# Patient Record
Sex: Male | Born: 1990 | Race: Black or African American | Hispanic: No | Marital: Single | Smoking: Never smoker
Health system: Southern US, Community
[De-identification: ages and names within clinical notes are randomized; demographics above are authoritative.]

## PROBLEM LIST (undated history)

## (undated) HISTORY — PX: KNEE SURGERY: SHX244

---
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
## 2011-07-24 ENCOUNTER — Emergency Department (HOSPITAL_COMMUNITY)
Admission: EM | Admit: 2011-07-24 | Discharge: 2011-07-24 | Disposition: A | Discharge status: Home / Self Care | Payer: BC Managed Care – PPO | Attending: Emergency Medicine | Admitting: Emergency Medicine

## 2011-07-24 ENCOUNTER — Encounter (HOSPITAL_COMMUNITY): Payer: Self-pay

## 2011-07-24 DIAGNOSIS — K029 Dental caries, unspecified: Secondary | ICD-10-CM | POA: Insufficient documentation

## 2011-07-24 DIAGNOSIS — K047 Periapical abscess without sinus: Secondary | ICD-10-CM

## 2011-07-24 MED ORDER — HYDROCODONE-ACETAMINOPHEN 5-325 MG PO TABS: 1.0000 | ORAL_TABLET | Freq: Four times a day (QID) | ORAL | Status: DC | PRN

## 2011-07-24 MED ORDER — PENICILLIN V POTASSIUM 500 MG PO TABS: 500.0000 mg | ORAL_TABLET | Freq: Four times a day (QID) | ORAL | Status: AC

## 2011-07-24 MED ORDER — IBUPROFEN 800 MG PO TABS: 800.0000 mg | ORAL_TABLET | Freq: Three times a day (TID) | ORAL | Status: DC | PRN

## 2011-07-24 MED ORDER — PENICILLIN V POTASSIUM 500 MG PO TABS: 500.0000 mg | ORAL_TABLET | Freq: Four times a day (QID) | ORAL | Status: DC

## 2011-07-24 MED ORDER — IBUPROFEN 800 MG PO TABS: 800.0000 mg | ORAL_TABLET | Freq: Three times a day (TID) | ORAL | Status: AC | PRN

## 2011-07-24 MED ORDER — HYDROCODONE-ACETAMINOPHEN 5-325 MG PO TABS: 1.0000 | ORAL_TABLET | Freq: Four times a day (QID) | ORAL | Status: AC | PRN

## 2011-07-24 NOTE — ED Provider Notes (Signed)
History     CSN: [id number]  Arrival date & time 07/24/11  1505   First MD Initiated Contact with Patient 07/24/11 1507      No chief complaint on file.   (Consider location/radiation/quality/duration/timing/severity/associated sxs/prior treatment) HPI The patient presents to the ER with a 4 day history of dental pain and possible abscess. The patient states that he has no diff breathing, tongue swelling, swelling under the tongue, fever, or N/V. Patient has had issues with this tooth in the past.  No past medical history on file.  No past surgical history on file.  No family history on file.  History  Substance Use Topics  . Smoking status: Not on file  . Smokeless tobacco: Not on file  . Alcohol Use: Not on file      Review of Systems All other systems negative except as documented in the HPI. All pertinent positives and negatives as reviewed in the HPI.  Allergies  Review of patient's allergies indicates not on file.  Home Medications  No current outpatient prescriptions on file.  There were no vitals taken for this visit.  Physical Exam Physical Examination: General appearance - alert, well appearing, and in no distress, oriented to person, place, and time and normal appearing weight Mental status - alert, oriented to person, place, and time, normal mood, behavior, speech, dress, motor activity, and thought processes Mouth - mucous membranes moist, pharynx normal without lesions and the patient has decay and damage to the R lower 2nd molar with swelling along the gum line. The airway is patent and there is no swelling under the tongue or in the neck. Neck - supple, no significant adenopathy Chest - clear to auscultation, no wheezes, rales or rhonchi, symmetric air entry Heart - normal rate, regular rhythm, normal S1, S2, no murmurs, rubs, clicks or gallops  ED Course  Procedures (including critical care time)  Patient will be referred to dentistry for  evaluation. The patient is advised to return here for any worsening in his condition.    MDM          Carlyle Dolly, PA-C 07/24/11 1513

## 2011-07-24 NOTE — ED Notes (Signed)
Pt in from home with dental pain states pain to the right lower jaw states broken tooth pt c/o difficulty when chewing some swelling noted to the jaw

## 2011-07-26 NOTE — ED Provider Notes (Signed)
Medical screening examination/treatment/procedure(s) were performed by non-physician practitioner and as supervising physician I was immediately available for consultation/collaboration.  Tynika Luddy T Jamerson Vonbargen, MD 07/26/11 0605 

## 2012-12-17 ENCOUNTER — Encounter (HOSPITAL_BASED_OUTPATIENT_CLINIC_OR_DEPARTMENT_OTHER): Payer: Self-pay

## 2012-12-17 ENCOUNTER — Emergency Department (HOSPITAL_BASED_OUTPATIENT_CLINIC_OR_DEPARTMENT_OTHER)
Admission: EM | Admit: 2012-12-17 | Discharge: 2012-12-17 | Disposition: A | Discharge status: Home / Self Care | Payer: BC Managed Care – PPO | Attending: Emergency Medicine | Admitting: Emergency Medicine

## 2012-12-17 DIAGNOSIS — S0990XA Unspecified injury of head, initial encounter: Secondary | ICD-10-CM | POA: Insufficient documentation

## 2012-12-17 DIAGNOSIS — Y9389 Activity, other specified: Secondary | ICD-10-CM | POA: Insufficient documentation

## 2012-12-17 DIAGNOSIS — IMO0002 Reserved for concepts with insufficient information to code with codable children: Secondary | ICD-10-CM | POA: Insufficient documentation

## 2012-12-17 DIAGNOSIS — Y92410 Unspecified street and highway as the place of occurrence of the external cause: Secondary | ICD-10-CM | POA: Insufficient documentation

## 2012-12-17 MED ORDER — IBUPROFEN 800 MG PO TABS
800.0000 mg | ORAL_TABLET | Freq: Once | ORAL | Status: AC
  Administered 2012-12-17: 800 mg via ORAL
  Filled 2012-12-17: qty 1
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
## 2012-12-18 NOTE — ED Provider Notes (Signed)
Medical screening examination/treatment/procedure(s) were performed by non-physician practitioner and as supervising physician I was immediately available for consultation/collaboration.  Toy Baker, MD 12/18/12 [phone]

## 2019-05-21 ENCOUNTER — Other Ambulatory Visit: Payer: Self-pay

## 2019-05-21 ENCOUNTER — Encounter (HOSPITAL_COMMUNITY): Payer: Self-pay | Admitting: Emergency Medicine

## 2019-05-21 ENCOUNTER — Ambulatory Visit (HOSPITAL_COMMUNITY)
Admission: EM | Admit: 2019-05-21 | Discharge: 2019-05-21 | Disposition: A | Discharge status: Home / Self Care | Payer: Self-pay | Attending: Physician Assistant | Admitting: Physician Assistant

## 2019-05-21 DIAGNOSIS — R1011 Right upper quadrant pain: Secondary | ICD-10-CM | POA: Insufficient documentation

## 2019-05-21 DIAGNOSIS — Z7689 Persons encountering health services in other specified circumstances: Secondary | ICD-10-CM | POA: Insufficient documentation

## 2019-05-21 DIAGNOSIS — R0789 Other chest pain: Secondary | ICD-10-CM | POA: Insufficient documentation

## 2019-05-21 LAB — COMPREHENSIVE METABOLIC PANEL
ALT: 19 U/L (ref 0–44)
AST: 23 U/L (ref 15–41)
Albumin: 3.6 g/dL (ref 3.5–5.0)
Alkaline Phosphatase: 63 U/L (ref 38–126)
Anion gap: 9 (ref 5–15)
BUN: 11 mg/dL (ref 6–20)
CO2: 27 mmol/L (ref 22–32)
Calcium: 9.1 mg/dL (ref 8.9–10.3)
Chloride: 103 mmol/L (ref 98–111)
Creatinine, Ser: 1.4 mg/dL — ABNORMAL HIGH (ref 0.61–1.24)
GFR calc Af Amer: >60 mL/min (ref >60)
GFR calc non Af Amer: >60 mL/min (ref >60)
Glucose, Bld: 67 mg/dL — ABNORMAL LOW (ref 70–99)
Potassium: 3.9 mmol/L (ref 3.5–5.1)
Sodium: 139 mmol/L (ref 135–145)
Total Bilirubin: 0.8 mg/dL (ref 0.3–1.2)
Total Protein: 6.1 g/dL — ABNORMAL LOW (ref 6.5–8.1)

## 2019-05-21 LAB — HIV ANTIBODY (ROUTINE TESTING W REFLEX): HIV Screen 4th Generation wRfx: NONREACTIVE

## 2019-05-21 MED ORDER — METRONIDAZOLE 500 MG PO TABS: 500.0000 mg | ORAL_TABLET | Freq: Two times a day (BID) | ORAL | 0 refills | Status: DC

## 2019-05-21 MED ORDER — OMEPRAZOLE 20 MG PO CPDR: 20.0000 mg | DELAYED_RELEASE_CAPSULE | Freq: Every day | ORAL | 0 refills | Status: AC

## 2019-05-21 MED ORDER — IBUPROFEN 400 MG PO TABS: 400.0000 mg | ORAL_TABLET | Freq: Four times a day (QID) | ORAL | 0 refills | Status: AC | PRN

## 2019-05-21 NOTE — Discharge Instructions (Signed)
I would like for you to take the medicines as prescribed and see if this helps your symptoms.  We have also sent your test out and well notify you of any necessary changes in your treatment.  We have treated you today for trichomonas exposure with metronidazole twice a day for 7 days.  Please complete this medication and abstain from sexual intercourse until you have completed this treatment and receive all of your test results.  I have supplied you with some options for follow-up and would like for you to call one of them to set up an appointment in 2 to 4 weeks.  If you are unable to be seen in that timeframe I would like for you to return to this clinic in 2 to 4 weeks if you are still experiencing a right-sided pains.  If your pains become significantly worse, you develop a fever or nausea and vomiting I would like for you to go to the emergency department.

## 2019-05-21 NOTE — ED Provider Notes (Signed)
[facility]    CSN: [id number] Arrival date & time: 05/21/19  1208      History   Chief Complaint Chief Complaint  Patient presents with  . Abdominal Pain    HPI Daniel Villa is a 29 y.o. male.   Patient presents to clinic today for 1 month of right-sided discomfort.  He states it is not necessarily pain but "discomfort" and he also believes this may be a"burning" sensation and it is hard for him to describe.  He reports that it can be positional at times such that laying on the side causes the issue.  But he also reports that driving causes the discomfort as well.  He denies nausea, vomiting.  He does endorse some burning sensation after eating.  He also endorses heartburn symptoms first in the morning if he eats a large meal right before bed.  He reports that food does not cause the pain on his right side and food does not make it better.  He reports daily bowel movements that are not difficult to pass.  Denies blood or dark stool.  He does report that he feels a" full sensation" in his upper abdomen over the last 1 month.  He reports good appetite but because of the full feeling he is probably eating a little less.  He denies significant bloating.  Patient does report that he was drinking much heavier last year but is currently drinking 3 days a week with at least 6-7 shots of alcohol.  Patient does not have primary care currently.  Patient additionally notes that he had a sexual partner tested positive for trichomonas recently.  He denies painful urination or urethral discharge.     History reviewed. No pertinent past medical history.  There are no problems to display for this patient.   Past Surgical History:  Procedure Laterality Date  . KNEE SURGERY         Home Medications    Prior to Admission medications   Medication Sig Start Date End Date Taking? Authorizing Provider  ibuprofen (ADVIL) 400 MG tablet Take 1 tablet (400 mg total) by mouth every 6  (six) hours as needed. 05/21/19   Trenton Passow, Marguerita Beards, PA-C  metroNIDAZOLE (FLAGYL) 500 MG tablet Take 1 tablet (500 mg total) by mouth 2 (two) times daily. 05/21/19   Luanne Krzyzanowski, Marguerita Beards, PA-C  omeprazole (PRILOSEC) 20 MG capsule Take 1 capsule (20 mg total) by mouth daily. 05/21/19 06/20/19  Takya Vandivier, Marguerita Beards, PA-C    Family History Family History  Problem Relation Age of Onset  . Diabetes Mother     Social History Social History   Tobacco Use  . Smoking status: Never Smoker  Substance Use Topics  . Alcohol use: Yes    Comment: weekends  . Drug use: Yes    Types: Marijuana    Comment: occasional     Allergies   Patient has no known allergies.   Review of Systems Review of Systems  Constitutional: Negative for chills, fatigue and fever.  Eyes: Negative for pain and visual disturbance.  Respiratory: Negative for cough and shortness of breath.   Cardiovascular: Negative for chest pain and palpitations.  Gastrointestinal: Positive for abdominal pain. Negative for abdominal distention, blood in stool, constipation, diarrhea, nausea and vomiting.  Genitourinary: Negative for dysuria and hematuria.  Musculoskeletal: Positive for back pain. Negative for arthralgias and myalgias.  Skin: Negative for color change and rash.  Neurological: Negative for seizures, syncope and headaches.  All other systems  reviewed and are negative.    Physical Exam Triage Vital Signs ED Triage Vitals [05/21/19 1246]  Enc Vitals Group     BP 114/61     Pulse Rate 77     Resp 16     Temp 99.5 F (37.5 C)     Temp Source Oral     SpO2 96 %     Weight      Height      Head Circumference      Peak Flow      Pain Score      Pain Loc      Pain Edu?      Excl. in GC?    No data found.  Updated Vital Signs BP 114/61 (BP Location: Right Arm)   Pulse 77   Temp 99.5 F (37.5 C) (Oral)   Resp 16   SpO2 96%   Visual Acuity Right Eye Distance:   Left Eye Distance:   Bilateral Distance:    Right Eye Near:    Left Eye Near:    Bilateral Near:     Physical Exam Vitals and nursing note reviewed.  Constitutional:      General: He is not in acute distress.    Appearance: He is well-developed and normal weight. He is not ill-appearing.  HENT:     Head: Normocephalic and atraumatic.  Eyes:     General: No scleral icterus.    Extraocular Movements: Extraocular movements intact.     Conjunctiva/sclera: Conjunctivae normal.     Pupils: Pupils are equal, round, and reactive to light.  Cardiovascular:     Rate and Rhythm: Normal rate and regular rhythm.     Heart sounds: No murmur.  Pulmonary:     Effort: Pulmonary effort is normal. No respiratory distress.     Breath sounds: Normal breath sounds.  Abdominal:     General: Abdomen is flat. Bowel sounds are normal. There is no distension. There are no signs of injury.     Palpations: Abdomen is soft. There is no hepatomegaly (Liver is not palpable and there is tympany directly below the right-sided ribs.), splenomegaly or mass.     Tenderness: There is abdominal tenderness (Patient reports some of the" discomfort" with palpation of the right lower ribs.  However there is no right upper quadrant tenderness on palpation). There is no right CVA tenderness or left CVA tenderness.     Hernia: No hernia is present.  Musculoskeletal:     Cervical back: Neck supple.  Skin:    General: Skin is warm and dry.     Capillary Refill: Capillary refill takes less than 2 seconds.  Neurological:     General: No focal deficit present.     Mental Status: He is alert and oriented to person, place, and time.  Psychiatric:        Mood and Affect: Mood normal.        Behavior: Behavior normal.      UC Treatments / Results  Labs (all labs ordered are listed, but only abnormal results are displayed) Labs Reviewed  HIV ANTIBODY (ROUTINE TESTING W REFLEX)  RPR  COMPREHENSIVE METABOLIC PANEL  CYTOLOGY, (ORAL, ANAL, URETHRAL) ANCILLARY ONLY     EKG   Radiology No results found.  Procedures Procedures (including critical care time)  Medications Ordered in UC Medications - No data to display  Initial Impression / Assessment and Plan / UC Course  I have reviewed the triage vital signs and the  nursing notes.  Pertinent labs & imaging results that were available during my care of the patient were reviewed by me and considered in my medical decision making (see chart for details).     #Abdominal discomfort #Right-sided chest wall pain #Exposure to STD Patient is a 29 year old male presenting with concerns of right sided pain and recent exposure to trichomonas.  With regard to his right-sided pain is nonspecific and exam is unrevealing at this time, however given continued heavy alcohol use we will get a CMP to evaluate liver enzymes.  This all so could be related to his GERD symptoms, will start on Prilosec and have follow-up with primary care or this clinic in 2 to 4 weeks.  Patient was treated with metronidazole today for his trichomonas exposure and swab was sent.  HIV and RPR were also tested today. -Ibuprofen was also recommended to start in a few days if no improvement with Prilosec.  Although it is unclear if this could be musculoskeletal in nature however given his positional nature this is a possibility. -Patient agrees this plan and options for follow-up were given and discussed.   Final Clinical Impressions(s) / UC Diagnoses   Final diagnoses:  Abdominal discomfort in right upper quadrant  Right-sided chest wall pain  Encounter for assessment of STD exposure     Discharge Instructions     I would like for you to take the medicines as prescribed and see if this helps your symptoms.  We have also sent your test out and well notify you of any necessary changes in your treatment.  We have treated you today for trichomonas exposure with metronidazole twice a day for 7 days.  Please complete this medication and  abstain from sexual intercourse until you have completed this treatment and receive all of your test results.  I have supplied you with some options for follow-up and would like for you to call one of them to set up an appointment in 2 to 4 weeks.  If you are unable to be seen in that timeframe I would like for you to return to this clinic in 2 to 4 weeks if you are still experiencing a right-sided pains.  If your pains become significantly worse, you develop a fever or nausea and vomiting I would like for you to go to the emergency department.      ED Prescriptions    Medication Sig Dispense Auth. Provider   omeprazole (PRILOSEC) 20 MG capsule Take 1 capsule (20 mg total) by mouth daily. 30 capsule Dejae Bernet, Veryl Speak, PA-C   ibuprofen (ADVIL) 400 MG tablet Take 1 tablet (400 mg total) by mouth every 6 (six) hours as needed. 30 tablet Max Nuno, Veryl Speak, PA-C   metroNIDAZOLE (FLAGYL) 500 MG tablet Take 1 tablet (500 mg total) by mouth 2 (two) times daily. 14 tablet Shefali Ng, Veryl Speak, PA-C     PDMP not reviewed this encounter.   Hermelinda Medicus, PA-C 05/21/19 1401

## 2019-05-21 NOTE — ED Triage Notes (Signed)
Right , upper abdominal pain, intermittent for a month.  Patient also has mid back pain.  No nausea, no vomiting.  Last bm was yesterday, normal per patient

## 2019-05-21 NOTE — ED Triage Notes (Signed)
Prior to leaving treatment room, patient requests testing for std, hiv.  Patient reports a "girl said she has trich".  Patient denies symptoms

## 2019-05-22 LAB — RPR: RPR Ser Ql: NONREACTIVE

## 2019-05-22 LAB — CYTOLOGY, (ORAL, ANAL, URETHRAL) ANCILLARY ONLY
Chlamydia: NEGATIVE
Neisseria Gonorrhea: NEGATIVE
Trichomonas: POSITIVE — AB

## 2019-05-23 ENCOUNTER — Telehealth (HOSPITAL_COMMUNITY): Payer: Self-pay | Admitting: Physician Assistant

## 2019-05-23 DIAGNOSIS — A599 Trichomoniasis, unspecified: Secondary | ICD-10-CM

## 2019-05-23 NOTE — Telephone Encounter (Signed)
Patient was called this morning to discussed results of his CMP and his cytology.  CMP had slight elevation in his creatinine at 1.4 however we do not have a baseline result previously to compare.  Protein was down slightly at 6.1.  Recommended the patient follow-up with her primary care physician or provider in 2 to 4 weeks for recheck of his kidney function levels.  Encouraged to drink plenty of water.  As well we discussed the cytology results for his trichomonas exposure and he did test positive for trichomonas.  However he was prescribed metronidazole twice daily for 7 days at visit.  His results for gonorrhea/chlamydia, HIV and syphilis were negative and these were reported to the patient.  Explained that there is no further treatment necessary at this time and that he should abstain from sex until he is completed all his treatment.  Instructed he should not have sexual intercourse with a partner that exposed him the trichomonas until he is not sure that that partner has been treated adequately as well.  Patient had no further questions at this time and and reported that his right-sided pain had improved from his last visit with usage of PPI and ibuprofen.

## 2019-11-30 ENCOUNTER — Encounter (HOSPITAL_COMMUNITY): Payer: Self-pay

## 2019-11-30 ENCOUNTER — Ambulatory Visit (HOSPITAL_COMMUNITY)
Admission: EM | Admit: 2019-11-30 | Discharge: 2019-11-30 | Disposition: A | Discharge status: Home / Self Care | Payer: Self-pay | Attending: Family Medicine | Admitting: Family Medicine

## 2019-11-30 DIAGNOSIS — L723 Sebaceous cyst: Secondary | ICD-10-CM

## 2019-11-30 NOTE — ED Provider Notes (Signed)
[facility]    CSN: [id number] Arrival date & time: 11/30/19  1157      History   Chief Complaint Chief Complaint  Patient presents with  . Abscess    HPI Daniel Villa is a 29 y.o. male.   Here today with a lump to left side of forehead that has been present over a year but recently gotten larger and more tender, soft to the touch. Notes his night cap is now bothering the area quite a bit. Has not tried anything for this OTC, and denies fever, chills, sweats, body aches, N/V.      History reviewed. No pertinent past medical history.  There are no problems to display for this patient.   Past Surgical History:  Procedure Laterality Date  . KNEE SURGERY         Home Medications    Prior to Admission medications   Medication Sig Start Date End Date Taking? Authorizing Provider  ibuprofen (ADVIL) 400 MG tablet Take 1 tablet (400 mg total) by mouth every 6 (six) hours as needed. 05/21/19   Darr, Veryl Speak, PA-C  metroNIDAZOLE (FLAGYL) 500 MG tablet Take 1 tablet (500 mg total) by mouth 2 (two) times daily. 05/21/19   Darr, Veryl Speak, PA-C  omeprazole (PRILOSEC) 20 MG capsule Take 1 capsule (20 mg total) by mouth daily. 05/21/19 06/20/19  Darr, Veryl Speak, PA-C    Family History Family History  Problem Relation Age of Onset  . Diabetes Mother     Social History Social History   Tobacco Use  . Smoking status: Never Smoker  Substance Use Topics  . Alcohol use: Yes    Alcohol/week: 10.0 standard drinks    Types: 10 Shots of liquor per week  . Drug use: Yes    Types: Marijuana    Comment: occasional     Allergies   Patient has no known allergies.   Review of Systems Review of Systems PER HPI   Physical Exam Triage Vital Signs ED Triage Vitals [11/30/19 1307]  Enc Vitals Group     BP 124/72     Pulse Rate 74     Resp 16     Temp 98.4 F (36.9 C)     Temp Source Oral     SpO2 97 %     Weight 190 lb (86.2 kg)     Height 5\' 11"  (1.803 m)     Head  Circumference      Peak Flow      Pain Score 0     Pain Loc      Pain Edu?      Excl. in GC?    No data found.  Updated Vital Signs BP 124/72   Pulse 74   Temp 98.4 F (36.9 C) (Oral)   Resp 16   Ht 5\' 11"  (1.803 m)   Wt 190 lb (86.2 kg)   SpO2 97%   BMI 26.50 kg/m   Visual Acuity Right Eye Distance:   Left Eye Distance:   Bilateral Distance:    Right Eye Near:   Left Eye Near:    Bilateral Near:     Physical Exam Vitals and nursing note reviewed.  Constitutional:      Appearance: Normal appearance.  HENT:     Head: Atraumatic.  Eyes:     Extraocular Movements: Extraocular movements intact.     Conjunctiva/sclera: Conjunctivae normal.  Cardiovascular:     Rate and Rhythm: Normal rate and regular rhythm.  Pulmonary:     Effort: Pulmonary effort is normal.     Breath sounds: Normal breath sounds.  Musculoskeletal:        General: Normal range of motion.     Cervical back: Normal range of motion and neck supple.  Skin:    General: Skin is warm and dry.     Comments: 1 cm fluctuant cyst present left forehead, non erythematous and no active drainage to area.   Neurological:     General: No focal deficit present.     Mental Status: He is oriented to person, place, and time.  Psychiatric:        Mood and Affect: Mood normal.        Thought Content: Thought content normal.        Judgment: Judgment normal.      UC Treatments / Results  Labs (all labs ordered are listed, but only abnormal results are displayed) Labs Reviewed - No data to display  EKG   Radiology No results found.  Procedures Incision and Drainage  Date/Time: 11/30/2019 4:37 PM Performed by: Particia Nearing, PA-C Authorized by: Particia Nearing, PA-C   Consent:    Consent obtained:  Verbal   Consent given by:  Patient   Risks discussed:  Bleeding, incomplete drainage, pain and infection   Alternatives discussed:  No treatment and referral Location:    Type:   Cyst   Location:  Head   Head location:  Face (left forehead) Pre-procedure details:    Skin preparation:  Chloraprep Anesthesia (see MAR for exact dosages):    Anesthesia method:  Local infiltration   Local anesthetic:  Lidocaine 2% w/o epi Procedure type:    Complexity:  Simple Procedure details:    Needle aspiration: no     Incision types:  Stab incision   Incision depth:  Dermal   Scalpel blade:  11   Wound management:  Probed and deloculated   Drainage:  Bloody (copious thick white sebaceous material)   Drainage amount:  Moderate   Wound treatment:  Wound left open   Packing materials:  None Post-procedure details:    Patient tolerance of procedure:  Tolerated well, no immediate complications   (including critical care time)  Medications Ordered in UC Medications - No data to display  Initial Impression / Assessment and Plan / UC Course  I have reviewed the triage vital signs and the nursing notes.  Pertinent labs & imaging results that were available during my care of the patient were reviewed by me and considered in my medical decision making (see chart for details).     Inflamed sebaceous cyst, patient requested I and D after alternatives discussed (observation, referral for full excision of cyst sac). Discussed home wound care, expectations with possible recurrence risk. Return precautions and infection red flags reviewed  Final Clinical Impressions(s) / UC Diagnoses   Final diagnoses:  Sebaceous cyst   Discharge Instructions   None    ED Prescriptions    None     PDMP not reviewed this encounter.   Particia Nearing, [place] 11/30/19 1642

## 2019-11-30 NOTE — ED Triage Notes (Signed)
Pt c/o abscess on left side of foreheadx42yr and 8 mos. Pt states the abscess recently started bothering him when he wears his night cap.

## 2020-03-30 ENCOUNTER — Encounter (HOSPITAL_COMMUNITY): Payer: Self-pay

## 2020-03-30 ENCOUNTER — Ambulatory Visit (HOSPITAL_COMMUNITY)
Admission: EM | Admit: 2020-03-30 | Discharge: 2020-03-30 | Disposition: A | Discharge status: Home / Self Care | Payer: Self-pay

## 2020-03-30 ENCOUNTER — Other Ambulatory Visit: Payer: Self-pay

## 2020-03-30 ENCOUNTER — Emergency Department (HOSPITAL_COMMUNITY)
Admission: EM | Admit: 2020-03-30 | Discharge: 2020-03-30 | Discharge status: Against Medical Advice | Payer: Self-pay | Attending: Emergency Medicine | Admitting: Emergency Medicine

## 2020-03-30 DIAGNOSIS — R0789 Other chest pain: Secondary | ICD-10-CM | POA: Insufficient documentation

## 2020-03-30 DIAGNOSIS — R55 Syncope and collapse: Secondary | ICD-10-CM | POA: Insufficient documentation

## 2020-03-30 LAB — BASIC METABOLIC PANEL
Anion gap: 10 (ref 5–15)
BUN: 10 mg/dL (ref 6–20)
CO2: 25 mmol/L (ref 22–32)
Calcium: 9.1 mg/dL (ref 8.9–10.3)
Chloride: 104 mmol/L (ref 98–111)
Creatinine, Ser: 1.44 mg/dL — ABNORMAL HIGH (ref 0.61–1.24)
GFR, Estimated: >60 mL/min (ref >60)
Glucose, Bld: 86 mg/dL (ref 70–99)
Potassium: 3.4 mmol/L — ABNORMAL LOW (ref 3.5–5.1)
Sodium: 139 mmol/L (ref 135–145)

## 2020-03-30 LAB — CBC
HCT: 48 % (ref 39.0–52.0)
Hemoglobin: 15.9 g/dL (ref 13.0–17.0)
MCH: 30.6 pg (ref 26.0–34.0)
MCHC: 33.1 g/dL (ref 30.0–36.0)
MCV: 92.3 fL (ref 80.0–100.0)
Platelets: 145 10*3/uL — ABNORMAL LOW (ref 150–400)
RBC: 5.2 MIL/uL (ref 4.22–5.81)
RDW: 13.6 % (ref 11.5–15.5)
WBC: 4 10*3/uL (ref 4.0–10.5)
nRBC: 0 % (ref 0.0–0.2)

## 2020-03-30 NOTE — ED Notes (Signed)
No answer in lobby.

## 2020-03-30 NOTE — ED Notes (Signed)
Pt returned to waiting area. 

## 2020-03-30 NOTE — ED Notes (Signed)
Pt not in waiting area 

## 2020-03-30 NOTE — ED Notes (Signed)
Pt also reports intermittent chest pain "around his heart" x1 week after his uncle passing away

## 2020-03-30 NOTE — ED Provider Notes (Signed)
[facility] EMERGENCY DEPARTMENT Provider Note   CSN: [id number] Arrival date & time: 03/30/20  4235     History Chief Complaint  Patient presents with   Near Syncope    Daniel Villa is a 30 y.o. male.  HPI 30 year old male with no significant medical history presents to the ER with complaints of a near syncopal episode yesterday.  Patient states that he had an episode yesterday when he stood up and started walking to the dresser, started to have blurry vision, felt like he "blanked out, could not coordinate his hands to grab at the dresser handles, states he felt really dizzy and fell backwards onto a couch.  Denies any head injury, denies any LOC though if he felt like he was out of it for quite a little bit.  He was given water by his roommates and did feel better.  He has not had any episodes like this since then.  He states that he is more or less back to normal.  He states that he has had intermittent central chest pains over the last few weeks, however he does not know if this is because he is going through a lot of stress due to the recent loss of his uncle.  He denies any chest pain now.  Denies any shortness of breath.  No prior history of PE.  He states that he was seen at urgent care and sent here to have a CT scan to rule out a stroke.  He denies any word slurring, vision changes outside of the near syncopal episode, no unilateral weakness.  No prior history of stroke, not on anticoagulation  History reviewed. No pertinent past medical history.  There are no problems to display for this patient.   Past Surgical History:  Procedure Laterality Date   KNEE SURGERY         Family History  Problem Relation Age of Onset   Diabetes Mother     Social History   Tobacco Use   Smoking status: Never Smoker   Smokeless tobacco: Never Used  Substance Use Topics   Alcohol use: Yes    Alcohol/week: 10.0 standard drinks    Types: 10 Shots of liquor per  week   Drug use: Yes    Types: Marijuana    Comment: occasional    Home Medications Prior to Admission medications   Medication Sig Start Date End Date Taking? Authorizing Provider  ibuprofen (ADVIL) 400 MG tablet Take 1 tablet (400 mg total) by mouth every 6 (six) hours as needed. 05/21/19   Darr, Gerilyn Pilgrim, PA-C  metroNIDAZOLE (FLAGYL) 500 MG tablet Take 1 tablet (500 mg total) by mouth 2 (two) times daily. 05/21/19   Darr, Gerilyn Pilgrim, PA-C  omeprazole (PRILOSEC) 20 MG capsule Take 1 capsule (20 mg total) by mouth daily. 05/21/19 06/20/19  Darr, Gerilyn Pilgrim, PA-C    Allergies    Patient has no known allergies.  Review of Systems   Review of Systems  Constitutional: Negative for chills and fever.  HENT: Negative for ear pain and sore throat.   Eyes: Negative for pain and visual disturbance.  Respiratory: Negative for cough and shortness of breath.   Cardiovascular: Positive for chest pain. Negative for palpitations.  Gastrointestinal: Negative for abdominal pain and vomiting.  Genitourinary: Negative for dysuria and hematuria.  Musculoskeletal: Negative for arthralgias and back pain.  Skin: Negative for color change and rash.  Neurological: Positive for syncope (Near syncope). Negative for seizures.  All other systems reviewed  and are negative.   Physical Exam Updated Vital Signs BP 119/66    Pulse 93    Temp 98.3 F (36.8 C) (Oral)    Resp 15    Wt 81.6 kg    SpO2 100%    BMI 25.10 kg/m   Physical Exam Vitals and nursing note reviewed.  Constitutional:      General: He is not in acute distress.    Appearance: He is well-developed and well-nourished. He is not ill-appearing, toxic-appearing or diaphoretic.  HENT:     Head: Normocephalic and atraumatic.  Eyes:     Conjunctiva/sclera: Conjunctivae normal.  Cardiovascular:     Rate and Rhythm: Normal rate and regular rhythm.     Pulses: Normal pulses.     Heart sounds: Normal heart sounds. No murmur heard.   Pulmonary:     Effort:  Pulmonary effort is normal. No respiratory distress.     Breath sounds: Normal breath sounds.  Abdominal:     General: Abdomen is flat.     Palpations: Abdomen is soft.     Tenderness: There is no abdominal tenderness.  Musculoskeletal:        General: No edema. Normal range of motion.     Cervical back: Neck supple.  Skin:    General: Skin is warm and dry.  Neurological:     General: No focal deficit present.     Mental Status: He is alert and oriented to person, place, and time.     Sensory: No sensory deficit.     Motor: No weakness.     Comments: Mental Status:  Alert, thought content appropriate, able to give a coherent history. Speech fluent without evidence of aphasia. Able to follow 2 step commands without difficulty.  Cranial Nerves:  II: Peripheral visual fields grossly normal, pupils equal, round, reactive to light III,IV, VI: ptosis not present, extra-ocular motions intact bilaterally  V,VII: smile symmetric, facial light touch sensation equal VIII: hearing grossly normal to voice  X: uvula elevates symmetrically  XI: bilateral shoulder shrug symmetric and strong XII: midline tongue extension without fassiculations Motor:  Normal tone. 5/5 strength of BUE and BLE major muscle groups including strong and equal grip strength and dorsiflexion/plantar flexion Sensory: light touch normal in all extremities. Cerebellar: normal finger-to-nose with bilateral upper extremities, Romberg sign absent Gait: normal gait and balance. Able to walk on toes and heels with ease.    Psychiatric:        Mood and Affect: Mood and affect and mood normal.        Behavior: Behavior normal.     ED Results / Procedures / Treatments   Labs (all labs ordered are listed, but only abnormal results are displayed) Labs Reviewed  BASIC METABOLIC PANEL - Abnormal; Notable for the following components:      Result Value   Potassium 3.4 (*)    Creatinine, Ser 1.44 (*)    All other components  within normal limits  CBC - Abnormal; Notable for the following components:   Platelets 145 (*)    All other components within normal limits  URINALYSIS, ROUTINE W REFLEX MICROSCOPIC  CBG MONITORING, ED    EKG EKG Interpretation  Date/Time:  Monday March 30 2020 09:50:16 EST Ventricular Rate:  78 PR Interval:  136 QRS Duration: 78 QT Interval:  354 QTC Calculation: 403 R Axis:   71 Text Interpretation: Normal sinus rhythm with sinus arrhythmia Normal ECG No previous ECGs available Confirmed by Alvira Monday ([id number]) on  03/30/2020 2:21:14 PM   Radiology No results found.  Procedures Procedures (including critical care time)  Medications Ordered in ED Medications - No data to display  ED Course  I have reviewed the triage vital signs and the nursing notes.  Pertinent labs & imaging results that were available during my care of the patient were reviewed by me and considered in my medical decision making (see chart for details).    MDM Rules/Calculators/A&P                         30 year old male who presents to the ER with complaints of near syncopal episode which occurred yesterday.  Endorses feeling more or less baseline today.  Has had intermittent chest pains, no chest pain now, states he has also had a lot of stress lately due to the loss of his uncle.  He states he has had very poor appetite due to this, and has had not eaten much or had much water.  He denies any shortness of breath on exertion.  Reports that urgent care sent him here to have a CT scan of his head to rule out stroke.  I do not see any notes in our system or in care everywhere with this urgent care visit.  His vital signs on arrival are overall reassuring, afebrile, not tachycardic, hypoxic or hypotensive.  Neuro exam is completely normal, patient ambulated without difficulty, no focal neurologic deficits on exam.  Lung sounds clear, abdomen soft and nontender.  Very low suspicion for stroke or brain  bleed as the patient does not endorse any symptoms currently, I explained this to the patient.  However I did order a CT scan to rule this out.  CBC without leukocytosis, normal hemoglobin, BMP with mild hypokalemia 3.4, creatinine of 1.44 which appears to be at baseline.   I was then informed by nursing staff that the patient had eloped from the emergency department without informing staff.  I did not have the opportunity to have the risk versus benefits conversation with him.  However I suspect that his symptoms were secondary due to probable dehydration rather than intracranial abnormality.  Low suspicion for PE or ACS.   Final Clinical Impression(s) / ED Diagnoses Final diagnoses:  Near syncope    Rx / DC Orders ED Discharge Orders    None       Leone Brand 03/30/20 1558    Alvira Monday, MD 03/31/20 1506

## 2020-03-30 NOTE — ED Notes (Signed)
Pt questioned wait time and then was seen walking out of ED.  He was informed to let staff know if he decided to leave.

## 2020-03-30 NOTE — ED Triage Notes (Signed)
Pt reports yesterday afternoon pt had a sudden onset of his head feeling hot, he became lightheaded and dizzy, reached for the dresser and began falling backwards onto the couch. Pt felt he was unaware of what was going on for a few seconds. No neuro deficits noted in triage

## 2020-03-30 NOTE — ED Notes (Signed)
Patient left treatment area without informing staff. Eloped from department

## 2020-05-17 ENCOUNTER — Other Ambulatory Visit: Payer: Self-pay

## 2020-05-17 ENCOUNTER — Encounter (HOSPITAL_COMMUNITY): Payer: Self-pay

## 2020-05-17 ENCOUNTER — Emergency Department (HOSPITAL_COMMUNITY)
Admission: EM | Admit: 2020-05-17 | Discharge: 2020-05-17 | Disposition: A | Discharge status: Home / Self Care | Payer: Self-pay | Attending: Emergency Medicine | Admitting: Emergency Medicine

## 2020-05-17 DIAGNOSIS — F10220 Alcohol dependence with intoxication, uncomplicated: Secondary | ICD-10-CM | POA: Insufficient documentation

## 2020-05-17 DIAGNOSIS — F10920 Alcohol use, unspecified with intoxication, uncomplicated: Secondary | ICD-10-CM

## 2020-05-17 DIAGNOSIS — Y906 Blood alcohol level of 120-199 mg/100 ml: Secondary | ICD-10-CM | POA: Insufficient documentation

## 2020-05-17 LAB — RAPID URINE DRUG SCREEN, HOSP PERFORMED
Amphetamines: NOT DETECTED
Barbiturates: NOT DETECTED
Benzodiazepines: NOT DETECTED
Cocaine: NOT DETECTED
Opiates: NOT DETECTED
Tetrahydrocannabinol: POSITIVE — AB

## 2020-05-17 LAB — COMPREHENSIVE METABOLIC PANEL
ALT: 22 U/L (ref 0–44)
AST: 35 U/L (ref 15–41)
Albumin: 4.4 g/dL (ref 3.5–5.0)
Alkaline Phosphatase: 67 U/L (ref 38–126)
Anion gap: 13 (ref 5–15)
BUN: 17 mg/dL (ref 6–20)
CO2: 21 mmol/L — ABNORMAL LOW (ref 22–32)
Calcium: 8.9 mg/dL (ref 8.9–10.3)
Chloride: 109 mmol/L (ref 98–111)
Creatinine, Ser: 1.57 mg/dL — ABNORMAL HIGH (ref 0.61–1.24)
GFR, Estimated: >60 mL/min (ref >60)
Glucose, Bld: 85 mg/dL (ref 70–99)
Potassium: 4.4 mmol/L (ref 3.5–5.1)
Sodium: 143 mmol/L (ref 135–145)
Total Bilirubin: 0.6 mg/dL (ref 0.3–1.2)
Total Protein: 7.5 g/dL (ref 6.5–8.1)

## 2020-05-17 LAB — CBC
HCT: 45.8 % (ref 39.0–52.0)
Hemoglobin: 15.6 g/dL (ref 13.0–17.0)
MCH: 32.2 pg (ref 26.0–34.0)
MCHC: 34.1 g/dL (ref 30.0–36.0)
MCV: 94.4 fL (ref 80.0–100.0)
Platelets: 216 10*3/uL (ref 150–400)
RBC: 4.85 MIL/uL (ref 4.22–5.81)
RDW: 14.5 % (ref 11.5–15.5)
WBC: 15.1 10*3/uL — ABNORMAL HIGH (ref 4.0–10.5)
nRBC: 0 % (ref 0.0–0.2)

## 2020-05-17 LAB — ACETAMINOPHEN LEVEL: Acetaminophen (Tylenol), Serum: <10 ug/mL — ABNORMAL LOW (ref 10–30)

## 2020-05-17 LAB — ETHANOL: Alcohol, Ethyl (B): 170 mg/dL — ABNORMAL HIGH (ref <10)

## 2020-05-17 LAB — SALICYLATE LEVEL: Salicylate Lvl: <7 mg/dL — ABNORMAL LOW (ref 7.0–30.0)

## 2020-05-17 NOTE — ED Triage Notes (Signed)
Pt arrives via [facility] in police custody. Per EMS: pt was intoxicated, driving then going to random houses. Police attempted to apprehend the pt and the pt then ran, Upon detaining the pt, LEO found drugs on the pts person. Pt denies any drug use and states that he has only drank today. Pt tearful and cooperative upon triage. Multiple abrasions noted on arms- pt states they have occurred when running through shrubbery when evading police

## 2020-05-17 NOTE — ED Notes (Signed)
Pt resting in bed at this time. NAD noted. Equal rise and fall of chest noted.  

## 2020-05-17 NOTE — ED Provider Notes (Signed)
[facility]-EMERGENCY DEPT Provider Note   CSN: [id number] Arrival date & time: 05/17/20  1941     History Chief Complaint  Patient presents with  . Alcohol Intoxication    Daniel Villa is a 30 y.o. male.  Per report, pt was driving while intoxicated, trying to evade police, then running through [place], but eventually apprehended and brought to ED for evaluation. Pt indicates etoh use, but cant quantify amount. Denies other drug use. Pt generally uncooperative with additional questions, but denies specific pain or complaint - level 5 caveat, pt uncooperative with HPI/ROS.   The history is provided by the patient and the EMS personnel. The history is limited by the condition of the patient.  Alcohol Intoxication       History reviewed. No pertinent past medical history.  There are no problems to display for this patient.   Past Surgical History:  Procedure Laterality Date  . KNEE SURGERY         Family History  Problem Relation Age of Onset  . Diabetes Mother     Social History   Tobacco Use  . Smoking status: Never Smoker  . Smokeless tobacco: Never Used  Substance Use Topics  . Alcohol use: Yes    Alcohol/week: 10.0 standard drinks    Types: 10 Shots of liquor per week  . Drug use: Yes    Types: Marijuana    Comment: occasional    Home Medications Prior to Admission medications   Medication Sig Start Date End Date Taking? Authorizing Provider  ibuprofen (ADVIL) 400 MG tablet Take 1 tablet (400 mg total) by mouth every 6 (six) hours as needed. 05/21/19   Darr, Gerilyn Pilgrim, PA-C  metroNIDAZOLE (FLAGYL) 500 MG tablet Take 1 tablet (500 mg total) by mouth 2 (two) times daily. 05/21/19   Darr, Gerilyn Pilgrim, PA-C  omeprazole (PRILOSEC) 20 MG capsule Take 1 capsule (20 mg total) by mouth daily. 05/21/19 06/20/19  Darr, Gerilyn Pilgrim, PA-C    Allergies    Patient has no known allergies.  Review of Systems   Review of Systems  Unable to perform ROS: Patient  unresponsive  pt uncooperative w hx/ros - level 5 caveat.   Physical Exam Updated Vital Signs BP 133/85 (BP Location: Left Arm)   Pulse (!) 105   Temp 98.1 F (36.7 C) (Oral)   Resp (!) 22   Ht 1.803 m (5\' 11" )   Wt 88.5 kg   SpO2 97%   BMI 27.20 kg/m   Physical Exam Vitals and nursing note reviewed.  Constitutional:      Appearance: Normal appearance. He is well-developed.  HENT:     Head: Atraumatic.     Nose: Nose normal.     Mouth/Throat:     Mouth: Mucous membranes are moist.     Pharynx: Oropharynx is clear.  Eyes:     General: No scleral icterus.    Conjunctiva/sclera: Conjunctivae normal.     Pupils: Pupils are equal, round, and reactive to light.  Neck:     Trachea: No tracheal deviation.  Cardiovascular:     Rate and Rhythm: Normal rate and regular rhythm.     Pulses: Normal pulses.     Heart sounds: Normal heart sounds. No murmur heard. No friction rub. No gallop.   Pulmonary:     Effort: Pulmonary effort is normal. No accessory muscle usage or respiratory distress.     Breath sounds: Normal breath sounds.  Chest:     Chest wall: No tenderness.  Abdominal:     General: Bowel sounds are normal. There is no distension.     Palpations: Abdomen is soft.     Tenderness: There is no abdominal tenderness. There is no guarding.     Comments: No abd bruising or contusion.   Genitourinary:    Comments: No cva tenderness. Musculoskeletal:        General: No swelling or tenderness.     Cervical back: Normal range of motion and neck supple. No rigidity or tenderness.     Comments: CTLS spine, non tender, aligned, no step off. Good rom bil extremities without pain or focal bony tenderness.   Skin:    General: Skin is warm and dry.     Findings: No rash.  Neurological:     Mental Status: He is alert.     Comments: Awake. Moves bil extremities purposefully.   Psychiatric:     Comments: Uncooperative. ?intoxicated.      ED Results / Procedures / Treatments    Labs (all labs ordered are listed, but only abnormal results are displayed) Results for orders placed or performed during the hospital encounter of 05/17/20  CBC  Result Value Ref Range   WBC 15.1 (H) 4.0 - 10.5 K/uL   RBC 4.85 4.22 - 5.81 MIL/uL   Hemoglobin 15.6 13.0 - 17.0 g/dL   HCT 82.5 05.3 - 97.6 %   MCV 94.4 80.0 - 100.0 fL   MCH 32.2 26.0 - 34.0 pg   MCHC 34.1 30.0 - 36.0 g/dL   RDW 73.4 19.3 - 79.0 %   Platelets 216 150 - 400 K/uL   nRBC 0.0 0.0 - 0.2 %  Comprehensive metabolic panel  Result Value Ref Range   Sodium 143 135 - 145 mmol/L   Potassium 4.4 3.5 - 5.1 mmol/L   Chloride 109 98 - 111 mmol/L   CO2 21 (L) 22 - 32 mmol/L   Glucose, Bld 85 70 - 99 mg/dL   BUN 17 6 - 20 mg/dL   Creatinine, Ser 2.40 (H) 0.61 - 1.24 mg/dL   Calcium 8.9 8.9 - 97.3 mg/dL   Total Protein 7.5 6.5 - 8.1 g/dL   Albumin 4.4 3.5 - 5.0 g/dL   AST 35 15 - 41 U/L   ALT 22 0 - 44 U/L   Alkaline Phosphatase 67 38 - 126 U/L   Total Bilirubin 0.6 0.3 - 1.2 mg/dL   GFR, Estimated >53 >29 mL/min   Anion gap 13 5 - 15  Ethanol  Result Value Ref Range   Alcohol, Ethyl (B) 170 (H) <10 mg/dL  Rapid urine drug screen (hospital performed)  Result Value Ref Range   Opiates NONE DETECTED NONE DETECTED   Cocaine NONE DETECTED NONE DETECTED   Benzodiazepines NONE DETECTED NONE DETECTED   Amphetamines NONE DETECTED NONE DETECTED   Tetrahydrocannabinol POSITIVE (A) NONE DETECTED   Barbiturates NONE DETECTED NONE DETECTED  Acetaminophen level  Result Value Ref Range   Acetaminophen (Tylenol), Serum <10 (L) 10 - 30 ug/mL  Salicylate level  Result Value Ref Range   Salicylate Lvl <7.0 (L) 7.0 - 30.0 mg/dL    EKG None  Radiology No results found.  Procedures Procedures   Medications Ordered in ED Medications - No data to display  ED Course  I have reviewed the triage vital signs and the nursing notes.  Pertinent labs & imaging results that were available during my care of the patient  were reviewed by me and considered in my medical  decision making (see chart for details).    MDM Rules/Calculators/A&P                         Labs sent. Continuous pulse ox and cardiac monitoring.   Reviewed nursing notes and prior charts for additional history.   Labs reviewed/interpreted by me - etoh high 170.   Po fluids, food.   Pt has eaten and drank. Is up/alert, ambulatory to bathroom, steady gait, no faintness or dizziness.  On recheck, bp 121/75. Currently, hr 84, rr 16, pulse ox 98% room air.   Pt appears stable for d/c.    Final Clinical Impression(s) / ED Diagnoses Final diagnoses:  None    Rx / DC Orders ED Discharge Orders    None       Cathren Laine, MD 05/17/20 1120

## 2020-05-17 NOTE — ED Notes (Signed)
Pt provided with water and sandwich

## 2020-05-17 NOTE — ED Notes (Addendum)
Pt resting in bed. NAD noted. Equal rise and fall of chest noted. Pt ambulatory to restroom without assistance. Pt provided with blue paper scrubs due to personal clothing being wet.

## 2020-05-17 NOTE — Discharge Instructions (Addendum)
It was our pleasure to provide your ER care today - we hope that you feel better.  Drink plenty of fluids/stay well hydrated.   Avoid alcohol use - see resource guide provided for treatment programs.   Follow up with primary care doctor in the next 1-2 weeks.   Return to ER if worse, new symptoms, fevers, new or severe pain, or other concern.

## 2020-05-17 NOTE — ED Notes (Signed)
EDP at bedside  

## 2022-06-20 ENCOUNTER — Encounter (HOSPITAL_COMMUNITY): Payer: Self-pay

## 2022-06-20 ENCOUNTER — Ambulatory Visit (HOSPITAL_COMMUNITY)
Admission: EM | Admit: 2022-06-20 | Discharge: 2022-06-20 | Disposition: A | Discharge status: Home / Self Care | Payer: Self-pay | Attending: Emergency Medicine | Admitting: Emergency Medicine

## 2022-06-20 DIAGNOSIS — Z202 Contact with and (suspected) exposure to infections with a predominantly sexual mode of transmission: Secondary | ICD-10-CM | POA: Insufficient documentation

## 2022-06-20 LAB — HIV ANTIBODY (ROUTINE TESTING W REFLEX): HIV Screen 4th Generation wRfx: NONREACTIVE

## 2022-06-20 MED ORDER — METRONIDAZOLE 500 MG PO TABS
ORAL_TABLET | ORAL | Status: AC
  Filled 2022-06-20: qty 4

## 2022-06-20 MED ORDER — METRONIDAZOLE 500 MG PO TABS
ORAL_TABLET | ORAL | Status: AC
  Filled 2022-06-20: qty 1

## 2022-06-20 MED ORDER — METRONIDAZOLE 500 MG PO TABS
2000.0000 mg | ORAL_TABLET | Freq: Once | ORAL | Status: AC
  Administered 2022-06-20: 2000 mg via ORAL

## 2022-06-20 MED ORDER — DOXYCYCLINE HYCLATE 100 MG PO CAPS: 100.0000 mg | ORAL_CAPSULE | Freq: Two times a day (BID) | ORAL | 0 refills | Status: AC

## 2022-06-20 NOTE — ED Triage Notes (Addendum)
Pt stated his partner told him she was tested positive for trich and chlamydia x 2days ago . Pt is requesting labs as well.

## 2022-06-20 NOTE — Discharge Instructions (Addendum)
We have treated you today for trichomonas.   To be treated for chlamydia, you also to take the full course of doxycycline.  Make sure you finish all the pills; you should not have any leftover. Take with food to avoid upset stomach.  We will call you if anything on your swab or blood work returns positive and requires further treatment.

## 2022-06-20 NOTE — ED Provider Notes (Signed)
[place]    CSN: [id number] Arrival date & time: 06/20/22  1109     History   Chief Complaint Chief Complaint  Patient presents with   Exposure to STD    HPI Daniel Villa is a 32 y.o. male.  Here for STD testing Reports partner tested positive for trichomonas and chlamydia   He is not having any symptoms. No discharge, dysuria, rash, lesions. He is requesting treatment today.  History reviewed. No pertinent past medical history.  There are no problems to display for this patient.   Past Surgical History:  Procedure Laterality Date   KNEE SURGERY         Home Medications    Prior to Admission medications   Medication Sig Start Date End Date Taking? Authorizing Provider  doxycycline (VIBRAMYCIN) 100 MG capsule Take 1 capsule (100 mg total) by mouth every 12 (twelve) hours for 7 days. 06/20/22 06/27/22 Yes Anusha Claus, Wells Guiles, PA-C  ibuprofen (ADVIL) 400 MG tablet Take 1 tablet (400 mg total) by mouth every 6 (six) hours as needed. 05/21/19   Darr, Edison Nasuti, PA-C  omeprazole (PRILOSEC) 20 MG capsule Take 1 capsule (20 mg total) by mouth daily. 05/21/19 06/20/19  Darr, Edison Nasuti, PA-C    Family History Family History  Problem Relation Age of Onset   Diabetes Mother     Social History Social History   Tobacco Use   Smoking status: Never   Smokeless tobacco: Never  Substance Use Topics   Alcohol use: Yes    Alcohol/week: 10.0 standard drinks of alcohol    Types: 10 Shots of liquor per week   Drug use: Yes    Types: Marijuana    Comment: occasional     Allergies   Patient has no known allergies.   Review of Systems Review of Systems As per HPI  Physical Exam Triage Vital Signs ED Triage Vitals [06/20/22 1324]  Enc Vitals Group     BP      Pulse      Resp      Temp      Temp src      SpO2      Weight      Height      Head Circumference      Peak Flow      Pain Score 0     Pain Loc      Pain Edu?      Excl. in [place]?    No data  found.  Updated Vital Signs BP 126/71 (BP Location: Left Arm)   Pulse 64   Temp 99 F (37.2 C) (Oral)   Resp 16   SpO2 97%    Physical Exam Vitals and nursing note reviewed.  Constitutional:      General: He is not in acute distress.    Appearance: Normal appearance.  HENT:     Mouth/Throat:     Pharynx: Oropharynx is clear.  Cardiovascular:     Rate and Rhythm: Normal rate and regular rhythm.     Pulses: Normal pulses.  Pulmonary:     Effort: Pulmonary effort is normal.  Neurological:     Mental Status: He is alert and oriented to person, place, and time.    UC Treatments / Results  Labs (all labs ordered are listed, but only abnormal results are displayed) Labs Reviewed  RPR  HIV ANTIBODY (ROUTINE TESTING W REFLEX)  CYTOLOGY, (ORAL, ANAL, URETHRAL) ANCILLARY ONLY    EKG  Radiology No results found.  Procedures Procedures   Medications Ordered in UC Medications  metroNIDAZOLE (FLAGYL) tablet 2,000 mg (has no administration in time range)    Initial Impression / Assessment and Plan / UC Course  I have reviewed the triage vital signs and the nursing notes.  Pertinent labs & imaging results that were available during my care of the patient were reviewed by me and considered in my medical decision making (see chart for details).  Cytology swab is pending. We discussed waiting for results and sending medicine to pharmacy if required. He wants to be treated today.  2g flagyl given in clinic Doxy BID x 7 sent to pharmacy  Return precautions   Final Clinical Impressions(s) / UC Diagnoses   Final diagnoses:  STD exposure     Discharge Instructions      We have treated you today for trichomonas.   To be treated for chlamydia, you also to take the full course of doxycycline.  Make sure you finish all the pills; you should not have any leftover. Take with food to avoid upset stomach.  We will call you if anything on your swab or blood work returns  positive and requires further treatment.     ED Prescriptions     Medication Sig Dispense Auth. Provider   doxycycline (VIBRAMYCIN) 100 MG capsule Take 1 capsule (100 mg total) by mouth every 12 (twelve) hours for 7 days. 14 capsule Yamel Bale, Wells Guiles, PA-C      PDMP not reviewed this encounter.   Les Pou, [place] 06/20/22 1415

## 2022-06-21 LAB — RPR: RPR Ser Ql: NONREACTIVE

## 2022-06-21 LAB — CYTOLOGY, (ORAL, ANAL, URETHRAL) ANCILLARY ONLY
Chlamydia: NEGATIVE
Comment: NEGATIVE
Comment: NEGATIVE
Comment: NORMAL
Neisseria Gonorrhea: NEGATIVE
Trichomonas: NEGATIVE
